# Patient Record
Sex: Male | Born: 1990 | Hispanic: No | Marital: Single | Smoking: Never smoker
Health system: Southern US, Community
[De-identification: ages and names within clinical notes are randomized; demographics above are authoritative.]

---

## 2013-10-07 ENCOUNTER — Emergency Department (INDEPENDENT_AMBULATORY_CARE_PROVIDER_SITE_OTHER): Payer: No Typology Code available for payment source

## 2013-10-07 ENCOUNTER — Emergency Department (INDEPENDENT_AMBULATORY_CARE_PROVIDER_SITE_OTHER)
Admission: EM | Admit: 2013-10-07 | Discharge: 2013-10-07 | Disposition: A | Discharge status: Nursing Home (Includes ICF) | Payer: No Typology Code available for payment source | Source: Home / Self Care | Attending: Emergency Medicine | Admitting: Emergency Medicine

## 2013-10-07 ENCOUNTER — Emergency Department (HOSPITAL_COMMUNITY)
Admission: EM | Admit: 2013-10-07 | Discharge: 2013-10-07 | Disposition: A | Discharge status: Home / Self Care | Payer: BC Managed Care – PPO | Attending: Emergency Medicine | Admitting: Emergency Medicine

## 2013-10-07 ENCOUNTER — Encounter (HOSPITAL_COMMUNITY): Payer: Self-pay | Admitting: Emergency Medicine

## 2013-10-07 DIAGNOSIS — M79609 Pain in unspecified limb: Secondary | ICD-10-CM

## 2013-10-07 DIAGNOSIS — Y9239 Other specified sports and athletic area as the place of occurrence of the external cause: Secondary | ICD-10-CM | POA: Insufficient documentation

## 2013-10-07 DIAGNOSIS — M79662 Pain in left lower leg: Secondary | ICD-10-CM

## 2013-10-07 DIAGNOSIS — M7989 Other specified soft tissue disorders: Secondary | ICD-10-CM | POA: Insufficient documentation

## 2013-10-07 DIAGNOSIS — S8010XA Contusion of unspecified lower leg, initial encounter: Secondary | ICD-10-CM | POA: Insufficient documentation

## 2013-10-07 DIAGNOSIS — Y92838 Other recreation area as the place of occurrence of the external cause: Secondary | ICD-10-CM

## 2013-10-07 DIAGNOSIS — Y9369 Activity, other involving other sports and athletics played as a team or group: Secondary | ICD-10-CM | POA: Insufficient documentation

## 2013-10-07 DIAGNOSIS — W219XXA Striking against or struck by unspecified sports equipment, initial encounter: Secondary | ICD-10-CM | POA: Insufficient documentation

## 2013-10-07 NOTE — ED Notes (Signed)
PT playing criket and hit with the ball L medial calf. Had pain Mon/Tues, no pain W/Thur, then Fri/Sa pain which continues today.

## 2013-10-07 NOTE — ED Provider Notes (Signed)
Medical screening examination/treatment/procedure(s) were performed by non-physician practitioner and as supervising physician I was immediately available for consultation/collaboration.  Leslee Homeavid Cobie Marcoux, M.D.   Reuben Likesavid C Honest Safranek, MD 10/07/13 2100

## 2013-10-07 NOTE — Discharge Instructions (Signed)
Hematoma A hematoma is a collection of blood under the skin, in an organ, in a body space, in a joint space, or in other tissue. The blood can clot to form a lump that you can see and feel. The lump is often firm and may sometimes become sore and tender. Most hematomas get better in a few days to weeks. However, some hematomas may be serious and require medical care. Hematomas can range in size from very small to very large. CAUSES  A hematoma can be caused by a blunt or penetrating injury. It can also be caused by spontaneous leakage from a blood vessel under the skin. Spontaneous leakage from a blood vessel is more likely to occur in older people, especially those taking blood thinners. Sometimes, a hematoma can develop after certain medical procedures. SIGNS AND SYMPTOMS   A firm lump on the body.  Possible pain and tenderness in the area.  Bruising.Blue, dark blue, purple-red, or yellowish skin may appear at the site of the hematoma if the hematoma is close to the surface of the skin. For hematomas in deeper tissues or body spaces, the signs and symptoms may be subtle. For example, an intra-abdominal hematoma may cause abdominal pain, weakness, fainting, and shortness of breath. An intracranial hematoma may cause a headache or symptoms such as weakness, trouble speaking, or a change in consciousness. DIAGNOSIS  A hematoma can usually be diagnosed based on your medical history and a physical exam. Imaging tests may be needed if your health care provider suspects a hematoma in deeper tissues or body spaces, such as the abdomen, head, or chest. These tests may include ultrasonography or a CT scan.  TREATMENT  Hematomas usually go away on their own over time. Rarely does the blood need to be drained out of the body. Large hematomas or those that may affect vital organs will sometimes need surgical drainage or monitoring. HOME CARE INSTRUCTIONS   Apply ice to the injured area:   Put ice in a  plastic bag.   Place a towel between your skin and the bag.   Leave the ice on for 20 minutes, 2 3 times a day for the first 1 to 2 days.   After the first 2 days, switch to using warm compresses on the hematoma.   Elevate the injured area to help decrease pain and swelling. Wrapping the area with an elastic bandage may also be helpful. Compression helps to reduce swelling and promotes shrinking of the hematoma. Make sure the bandage is not wrapped too tight.   If your hematoma is on a lower extremity and is painful, crutches may be helpful for a couple days.   Only take over-the-counter or prescription medicines as directed by your health care provider. SEEK IMMEDIATE MEDICAL CARE IF:   You have increasing pain, or your pain is not controlled with medicine.   You have a fever.   You have worsening swelling or discoloration.   Your skin over the hematoma breaks or starts bleeding.   Your hematoma is in your chest or abdomen and you have weakness, shortness of breath, or a change in consciousness.  Your hematoma is on your scalp (caused by a fall or injury) and you have a worsening headache or a change in alertness or consciousness. MAKE SURE YOU:   Understand these instructions.  Will watch your condition.  Will get help right away if you are not doing well or get worse. Document Released: 02/03/2004 Document Revised: 02/21/2013 Document Reviewed:   11/29/2012 [employer] Patient Information 2014 [employer].  

## 2013-10-07 NOTE — ED Provider Notes (Signed)
CSN: [id number]     Arrival date & time 10/07/13  1222 History   First MD Initiated Contact with Patient 10/07/13 1353     Chief Complaint  Patient presents with  . Leg Injury   (Consider location/radiation/quality/duration/timing/severity/associated sxs/prior Treatment) HPI Comments: Patient states he was in very little pain on days 1 & 2 after injury. Was seen at local care facility in [place] and advised to use ibuprofen 600 mg TID. Reports over last 4 days he has developed increased pain at left posterior knee and calf, particularly with extension of left lower leg. Is concerned about increased pain and swelling.   Patient is a 23 y.o. male presenting with leg pain. The history is provided by the patient.  Leg Pain Location:  Leg Time since incident:  6 days Injury: yes   Mechanism of injury comment:  Was stuck in the left lower leg by ball while playing cricket Leg location:  L lower leg Pain details:    Quality:  Aching and throbbing   Radiates to: radiates to left posterior knee.   Severity:  Moderate   Onset quality:  Gradual   Duration:  4 days   Timing:  Constant Chronicity:  New Dislocation: no   Worsened by:  Extension Ineffective treatments:  NSAIDs Associated symptoms: decreased ROM, stiffness and swelling   Associated symptoms: no fever, no numbness and no tingling     History reviewed. No pertinent past medical history. History reviewed. No pertinent past surgical history. History reviewed. No pertinent family history. History  Substance Use Topics  . Smoking status: Not on file  . Smokeless tobacco: Not on file  . Alcohol Use: No    Review of Systems  Constitutional: Negative for fever.  Respiratory: Negative for cough, chest tightness and shortness of breath.   Cardiovascular: Negative for chest pain.  Musculoskeletal: Positive for stiffness.  All other systems reviewed and are negative.    Allergies  Review of patient's allergies indicates  no known allergies.  Home Medications  No current outpatient prescriptions on file. BP 133/75  Pulse 81  Temp(Src) 99.6 F (37.6 C) (Oral)  Resp 16  SpO2 100% Physical Exam  Nursing note and vitals reviewed. Constitutional: He is oriented to person, place, and time. He appears well-developed and well-nourished. No distress.  HENT:  Head: Normocephalic and atraumatic.  Eyes: Conjunctivae are normal.  Neck: No JVD present.  Cardiovascular: Normal rate, regular rhythm and normal heart sounds.   Pulmonary/Chest: Effort normal and breath sounds normal.  Musculoskeletal: Normal range of motion.       Left lower leg: He exhibits tenderness and swelling. He exhibits no bony tenderness, no edema, no deformity and no laceration.       Legs: No pedal or ankle edema. Distal pulses intact. Distal function and sensation intact. Calf soft and without indication of compartment syndrome. Achilles mechanism intact.   Neurological: He is alert and oriented to person, place, and time.  Skin: Skin is warm and dry. No rash noted.  Psychiatric: He has a normal mood and affect. His behavior is normal.    ED Course  Procedures (including critical care time) Labs Review Labs Reviewed - No data to display Imaging Review Dg Tibia/fibula Left  10/07/2013   CLINICAL DATA:  Pain post trauma  EXAM: LEFT TIBIA AND FIBULA - 2 VIEW  COMPARISON:  None.  FINDINGS: Frontal and lateral views were obtained. There is no fracture or dislocation. No abnormal periosteal reaction. Joint spaces appear  intact. No soft tissue calcification. There is, however, soft tissue fullness posterior to the knee joint.  IMPRESSION: Soft tissue fullness posterior to the knee joint. Question hematoma or Baker cyst as most likely etiologies given the clinical history. No fracture or dislocation. No knee joint effusion.   Electronically Signed   By: Bretta Bang M.D.   On: 10/07/2013 14:31     MDM   1. Pain of left lower leg     Xrays likely suggestive hematoma of left popliteal fossa. No clinical evidence of LLE compartment syndrome. Outpatient management strategy discussed with orthopedist on call (Dr. Pat Patrick with [facility]) who suggested conservative management with elevation, pain management, knee immobilization in slight flexion and crutches with no sports participation for 2-3 weeks.  It was recommended that given patient's increase in pain and swelling, despite lack of pedal or ankle edema, that he undergo duplex U/S of LLE to investigate for DVT. If study negative, management as above. Patient to be transported by shuttle to [facility] ER for imaging.    Jess Barters [place], [place] 10/07/13 [phone]

## 2013-10-07 NOTE — ED Notes (Signed)
Pt  Reports   He  Injured  His  l  Lower leg  One  Week  Ago  While  Playing  crickett  He  Has  Pain  /  Swelling      To l  Calf  Area     With pain on  Weight  Bearing

## 2013-10-07 NOTE — ED Provider Notes (Signed)
I saw and evaluated the patient, reviewed the resident's note and I agree with the findings and plan.   EKG Interpretation None      Pt is a 23 y.o. male with no significant paracervical history who was hit in the left posterior calf with a cricket ball 2 days ago. He has had pain with extension of his left leg. He is also had mild swelling. No signs of cellulitis on exam. He is neurovascularly intact distally. He has had negative x-rays. He was sent from urgent care to rule out a DVT. Venous Doppler of lower extremity is negative. Will discharge home  Layla MawKristen N Arial Galligan, DO 10/08/13 0003

## 2013-10-07 NOTE — ED Notes (Signed)
The pt was struck by a tennis ball in the  Lt lower leg 7 days ago.  Pain and swelling

## 2013-10-07 NOTE — ED Provider Notes (Signed)
CSN: [id number]     Arrival date & time 10/07/13  1514 History   First MD Initiated Contact with Patient 10/07/13 1608     Chief Complaint  Patient presents with  . Leg Pain   HPI 23 year old male was playing cricket to 2 days when the ball hit him in the posterior left calf. He has been having moderate pain since that time. He has  had some mild swelling. No bruising. Pain is sore, aching. It is aggravated by touching his leg. Aggravated by walking. Relieved by nothing. He's not had any swelling in the foot or ankle. He's been able to ambulate on it. He's been taking Motrin with some relief.  Was seen in urgent care today. X-rays were negative for acute fracture. The urgent care provider sent him here to be evaluated for DVT. He has never had a history of DVT or PE. No family history of venous thromboembolism. No respiratory symptoms. No immobilization, surgeries, hospitalizations, hormone use, or other identifiable risk factors for DVT other than the mild trauma.   History reviewed. No pertinent past medical history. History reviewed. No pertinent past surgical history. No family history on file. History  Substance Use Topics  . Smoking status: Never Smoker   . Smokeless tobacco: Not on file  . Alcohol Use: No    Review of Systems  Constitutional: Negative for fever and chills.  HENT: Negative for congestion and rhinorrhea.   Eyes: Negative for visual disturbance.  Respiratory: Negative for cough and shortness of breath.   Cardiovascular: Positive for leg swelling. Negative for chest pain.  Gastrointestinal: Negative for nausea, vomiting, abdominal pain and diarrhea.  Genitourinary: Negative for dysuria, urgency, frequency, flank pain and difficulty urinating.  Musculoskeletal: Negative for back pain, neck pain and neck stiffness.  Skin: Negative for rash.  Neurological: Negative for syncope, weakness, numbness and headaches.  All other systems reviewed and are  negative.      Allergies  Review of patient's allergies indicates no known allergies.  Home Medications   Current Outpatient Rx  Name  Route  Sig  Dispense  Refill  . ibuprofen (ADVIL,MOTRIN) 200 MG tablet   Oral   Take 600 mg by mouth 2 (two) times daily as needed for moderate pain.         Marland Kitchen PRESCRIPTION MEDICATION   Oral   Take 1 tablet by mouth daily. Medication for face          BP 119/72  Pulse 100  Temp(Src) 98.7 F (37.1 C) (Oral)  Resp 18  SpO2 100% Physical Exam  Nursing note and vitals reviewed. Constitutional: He is oriented to person, place, and time. He appears well-developed and well-nourished. No distress.  HENT:  Head: Normocephalic and atraumatic.  Mouth/Throat: Oropharynx is clear and moist.  Eyes: Conjunctivae and EOM are normal. Pupils are equal, round, and reactive to light. No scleral icterus.  Neck: Normal range of motion. Neck supple. No JVD present.  Cardiovascular: Normal rate, regular rhythm, normal heart sounds and intact distal pulses.  Exam reveals no gallop and no friction rub.   No murmur heard. Pulmonary/Chest: Effort normal and breath sounds normal. No respiratory distress. He has no wheezes. He has no rales.  Abdominal: Soft. Bowel sounds are normal. He exhibits no distension. There is no tenderness. There is no rebound and no guarding.  Musculoskeletal: He exhibits no edema.  Left leg normal to inspection. No bruising. Calf is not swollen. Compartments are soft. No pain with passive range of  motion at the knee or ankle. He has normal active range of motion in all major joints. He's able to ambulate normally. Negative Homans sign, no palpable cord.  Neurological: He is alert and oriented to person, place, and time. No cranial nerve deficit. He exhibits normal muscle tone. Coordination normal.  Skin: Skin is warm and dry. He is not diaphoretic.    ED Course  Procedures (including critical care time) Labs Review Labs Reviewed - No  data to display Imaging Review Dg Tibia/fibula Left  10/07/2013   CLINICAL DATA:  Pain post trauma  EXAM: LEFT TIBIA AND FIBULA - 2 VIEW  COMPARISON:  None.  FINDINGS: Frontal and lateral views were obtained. There is no fracture or dislocation. No abnormal periosteal reaction. Joint spaces appear intact. No soft tissue calcification. There is, however, soft tissue fullness posterior to the knee joint.  IMPRESSION: Soft tissue fullness posterior to the knee joint. Question hematoma or Baker cyst as most likely etiologies given the clinical history. No fracture or dislocation. No knee joint effusion.   Electronically Signed   By: Bretta BangWilliam  Woodruff M.D.   On: 10/07/2013 14:31     EKG Interpretation None      MDM   23 year old male presents with leg pain. He was struck by a ball while playing cricket couple days ago. He's had mild swelling, moderate pain. Was seen in urgent care today. X-rays were negative. He was sent here to be evaluated for DVT. His exam is not concerning for DVT. He has no leg swelling on exam, mild tenderness, no risk factors identified. DVT study was negative. In addition he has a 4 x 0.5 cm hematoma. Will treat with NSAIDs. He is to folllowup with his primary care physician as needed.  VASCULAR LAB  PRELIMINARY PRELIMINARY PRELIMINARY PRELIMINARY  Left lower extremity venous duplex completed.  Preliminary report: Left: No evidence of DVT, superficial thrombosis, or Baker's cyst. Noted is a hematoma measuring 3.9 cm x 1.5 cm located in the proximal to mid calf.  SLAUGHTER, [place], RVS  10/07/2013, 6:10 PM  Final diagnoses:  Leg hematoma      Toney SangJerrid Falen Lehrmann, MD 10/08/13 [id number]

## 2013-10-07 NOTE — Progress Notes (Signed)
VASCULAR LAB PRELIMINARY  PRELIMINARY  PRELIMINARY  PRELIMINARY  Left lower extremity venous duplex completed.    Preliminary report:  Left:  No evidence of DVT, superficial thrombosis, or Baker's cyst. Noted is a hematoma measuring 3.9 cm x 1.5 cm located in the proximal to mid calf.  Jimie Kuwahara, RVS 10/07/2013, 6:10 PM

## 2013-10-07 NOTE — ED Notes (Signed)
The pain is worse with weight bearing

## 2015-05-11 IMAGING — CR DG TIBIA/FIBULA 2V*L*
3 series · 3 of 3 positions shown · non-contrast
Comparison: None.

CLINICAL DATA: Pain post trauma

EXAM:
LEFT TIBIA AND FIBULA - 2 VIEW

[view not recorded (1 of 3)]
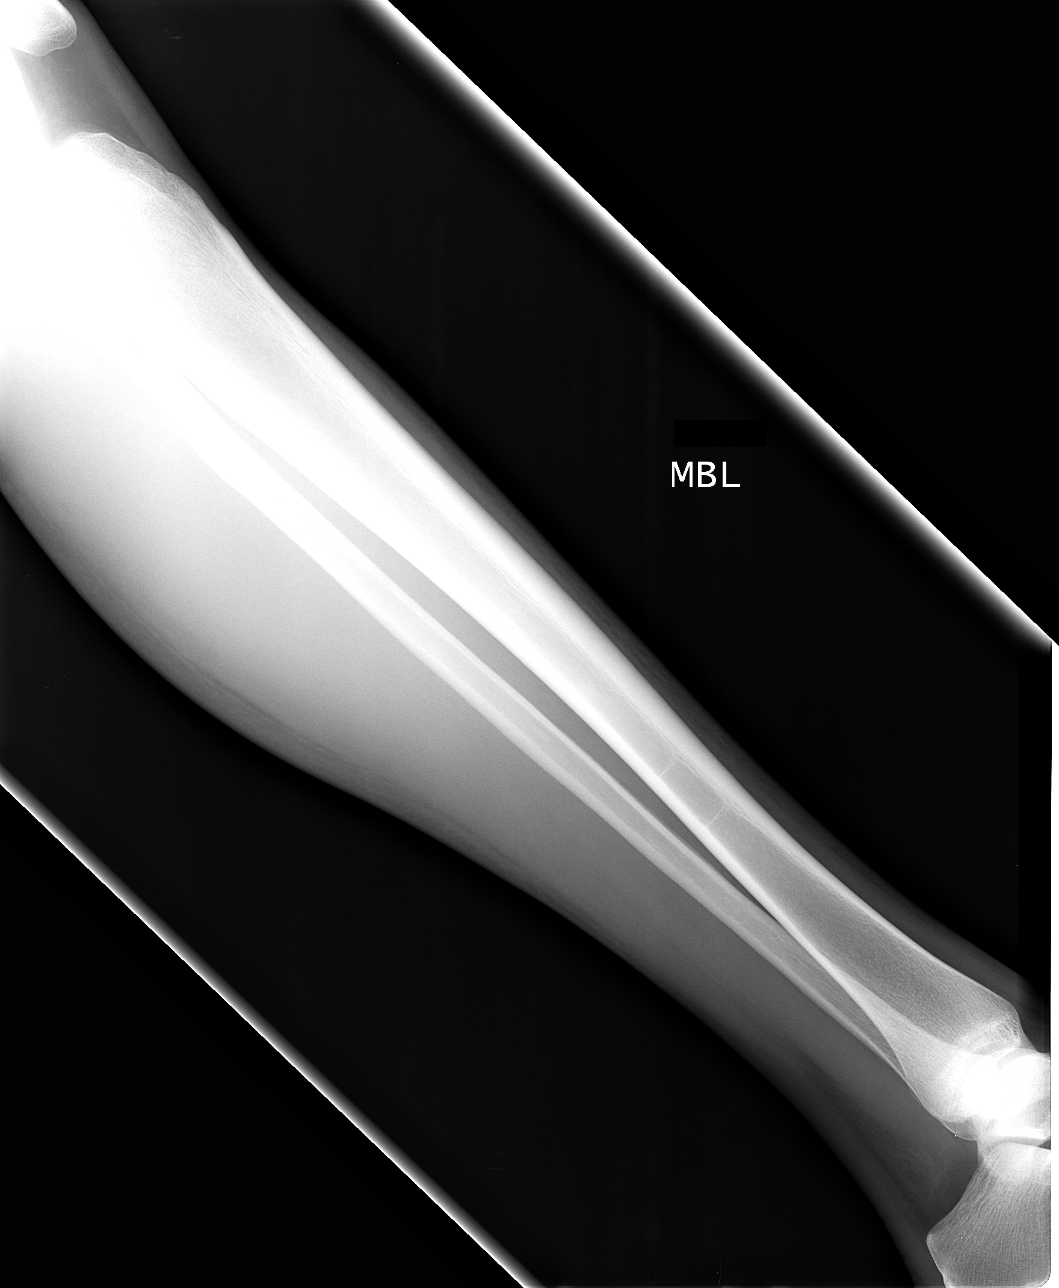

[view not recorded (2 of 3)]
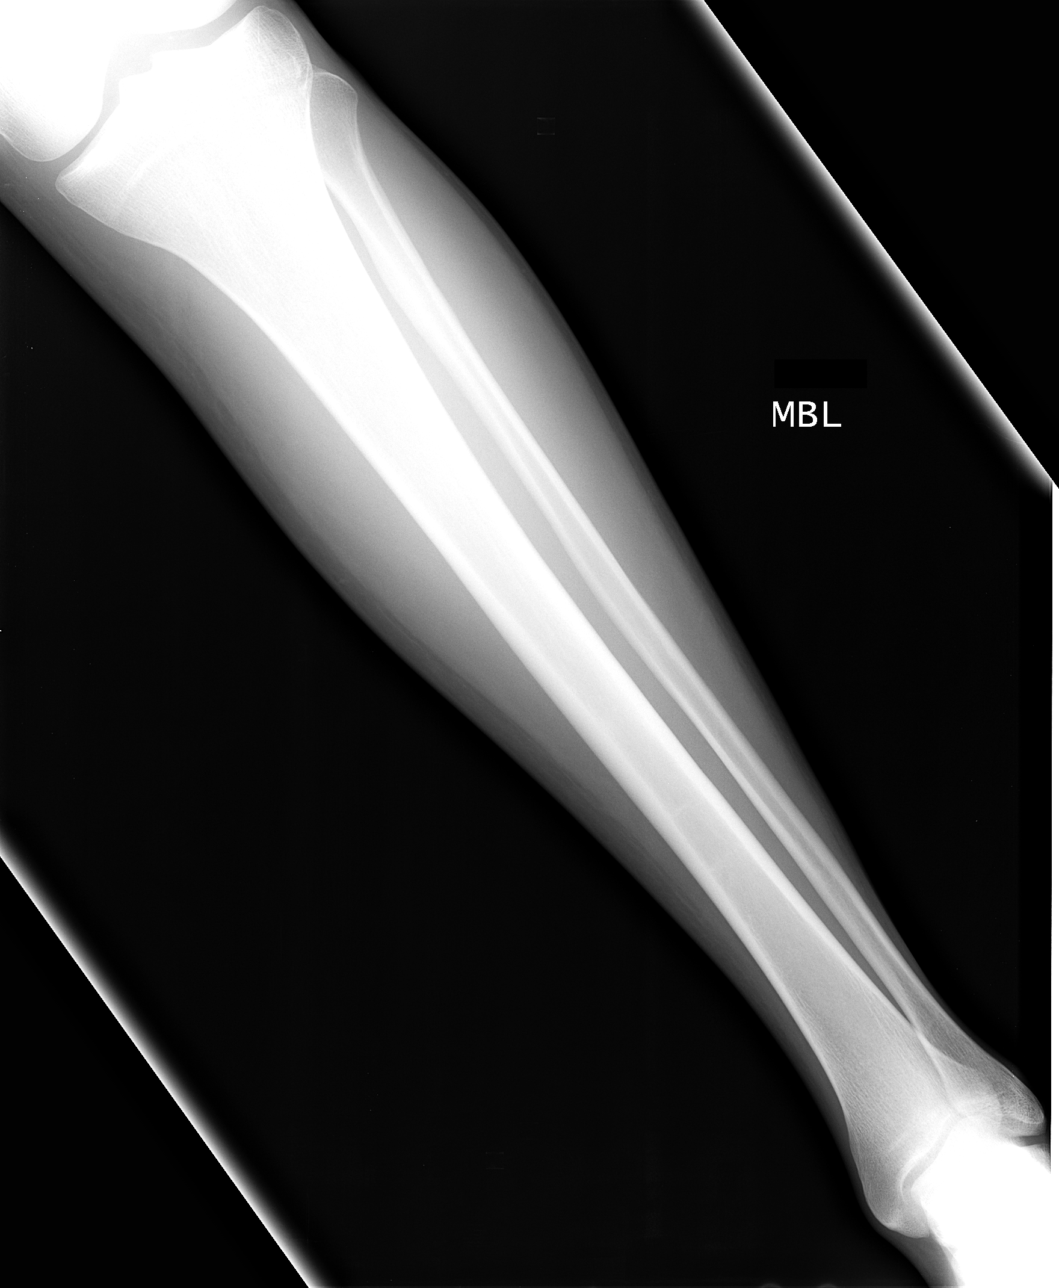

[view not recorded (3 of 3)]
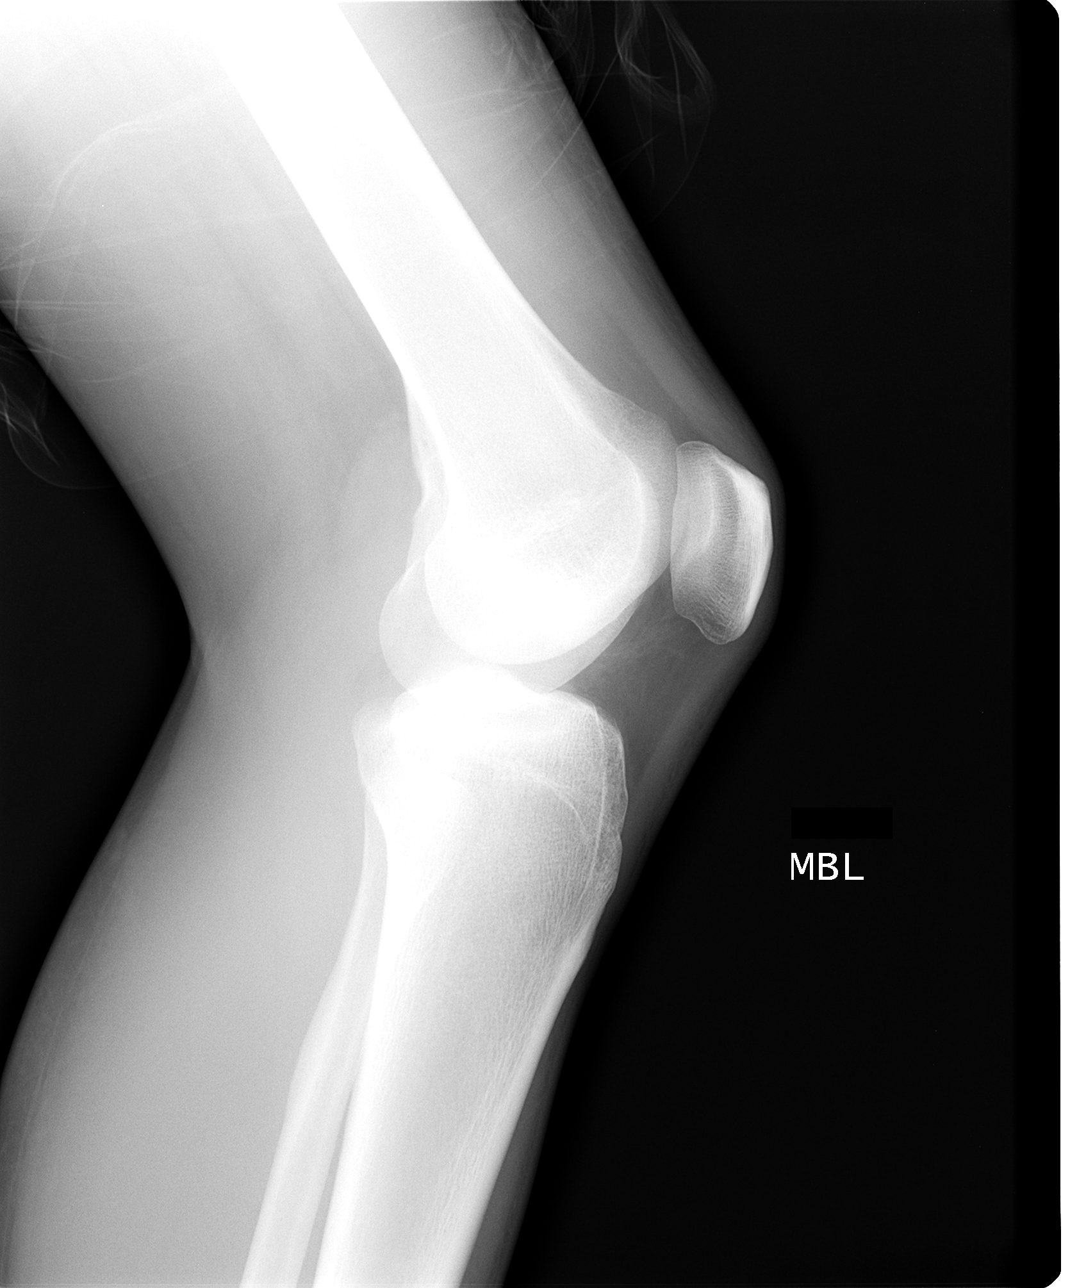

[3 of 3 positions shown; findings below may reference images not displayed]

FINDINGS: Frontal and lateral views were obtained. There is no fracture or
dislocation. No abnormal periosteal reaction. Joint spaces appear
intact. No soft tissue calcification. There is, however, soft tissue
fullness posterior to the knee joint.
IMPRESSION: Soft tissue fullness posterior to the knee joint. Question hematoma
or Baker cyst as most likely etiologies given the clinical history.
No fracture or dislocation. No knee joint effusion.
